# Patient Record
Sex: Male | Born: 1955 | Race: White | Hispanic: No | Smoking: Never smoker
Health system: Southern US, Community
[De-identification: ages and names within clinical notes are randomized; demographics above are authoritative.]

## PROBLEM LIST (undated history)

## (undated) DIAGNOSIS — M109 Gout, unspecified: Secondary | ICD-10-CM

## (undated) DIAGNOSIS — G20 Parkinson's disease: Secondary | ICD-10-CM

## (undated) DIAGNOSIS — G2581 Restless legs syndrome: Secondary | ICD-10-CM

## (undated) HISTORY — PX: BRAIN SURGERY: SHX531

## (undated) HISTORY — PX: TONSILLECTOMY: SUR1361

## (undated) HISTORY — PX: BACK SURGERY: SHX140

---

## 1997-10-10 ENCOUNTER — Emergency Department (HOSPITAL_COMMUNITY): Admission: EM | Admit: 1997-10-10 | Discharge: 1997-10-10 | Payer: Self-pay | Admitting: Internal Medicine

## 1998-09-02 ENCOUNTER — Inpatient Hospital Stay (HOSPITAL_COMMUNITY): Admission: EM | Admit: 1998-09-02 | Discharge: 1998-09-04 | Payer: Self-pay | Admitting: Pediatrics

## 1998-09-02 ENCOUNTER — Encounter: Payer: Self-pay | Admitting: *Deleted

## 1998-09-03 ENCOUNTER — Encounter: Payer: Self-pay | Admitting: *Deleted

## 1998-09-04 ENCOUNTER — Encounter: Payer: Self-pay | Admitting: *Deleted

## 1998-09-11 ENCOUNTER — Ambulatory Visit (HOSPITAL_COMMUNITY): Admission: RE | Admit: 1998-09-11 | Discharge: 1998-09-11 | Payer: Self-pay | Admitting: *Deleted

## 1998-09-11 ENCOUNTER — Encounter: Payer: Self-pay | Admitting: *Deleted

## 2002-11-13 ENCOUNTER — Encounter: Payer: Self-pay | Admitting: Emergency Medicine

## 2002-11-13 ENCOUNTER — Emergency Department (HOSPITAL_COMMUNITY): Admission: EM | Admit: 2002-11-13 | Discharge: 2002-11-13 | Payer: Self-pay | Admitting: Emergency Medicine

## 2009-09-27 ENCOUNTER — Ambulatory Visit: Payer: Self-pay | Admitting: Interventional Radiology

## 2009-09-27 ENCOUNTER — Emergency Department (HOSPITAL_BASED_OUTPATIENT_CLINIC_OR_DEPARTMENT_OTHER): Admission: EM | Admit: 2009-09-27 | Discharge: 2009-09-27 | Payer: Self-pay | Admitting: Emergency Medicine

## 2010-07-14 IMAGING — CR DG CHEST 2V
2 series · 2 of 2 positions shown · non-contrast
Comparison: None.

CLINICAL DATA: Leg swelling.  Short of breath.

CHEST - 2 VIEW

[w chest pa]
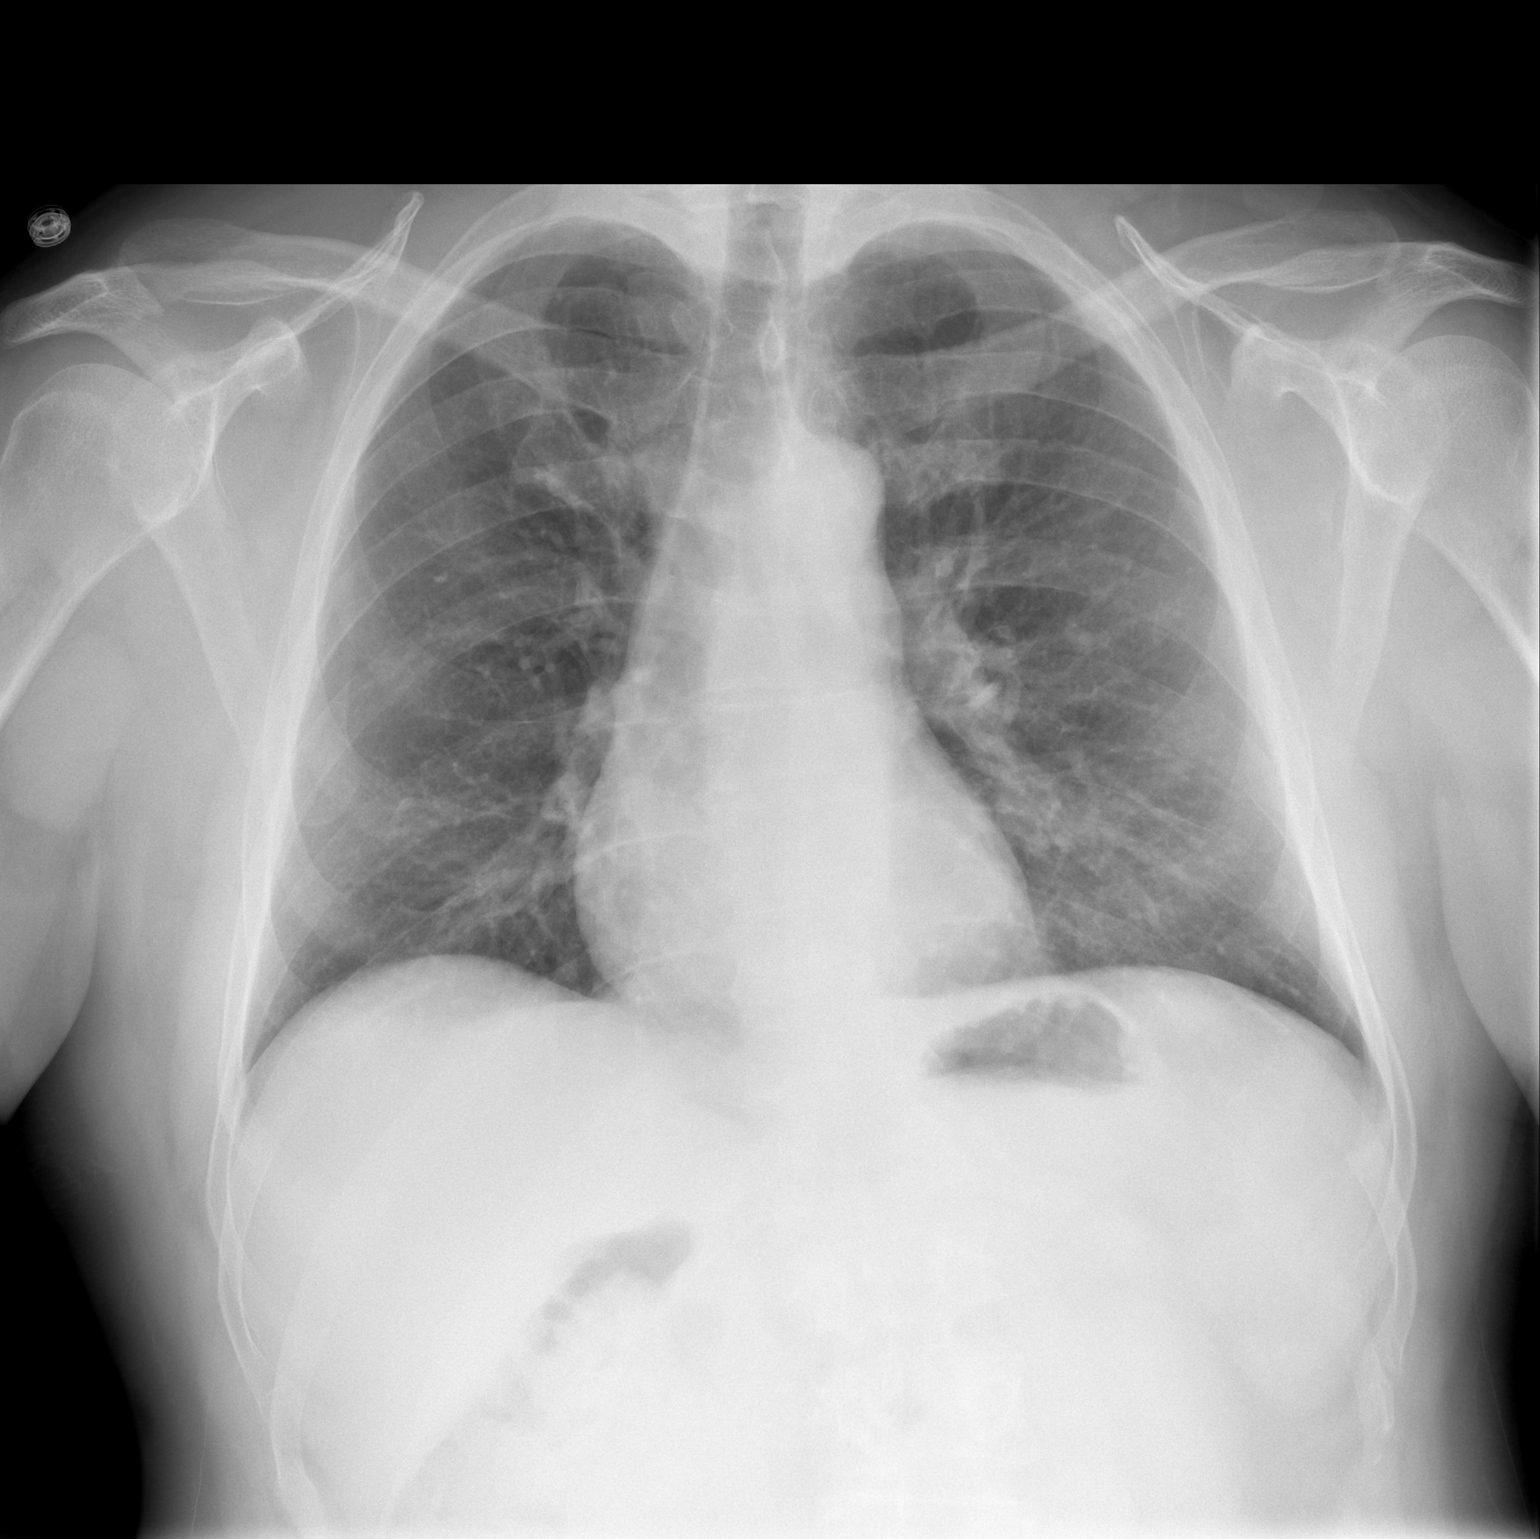

[w chest lat]
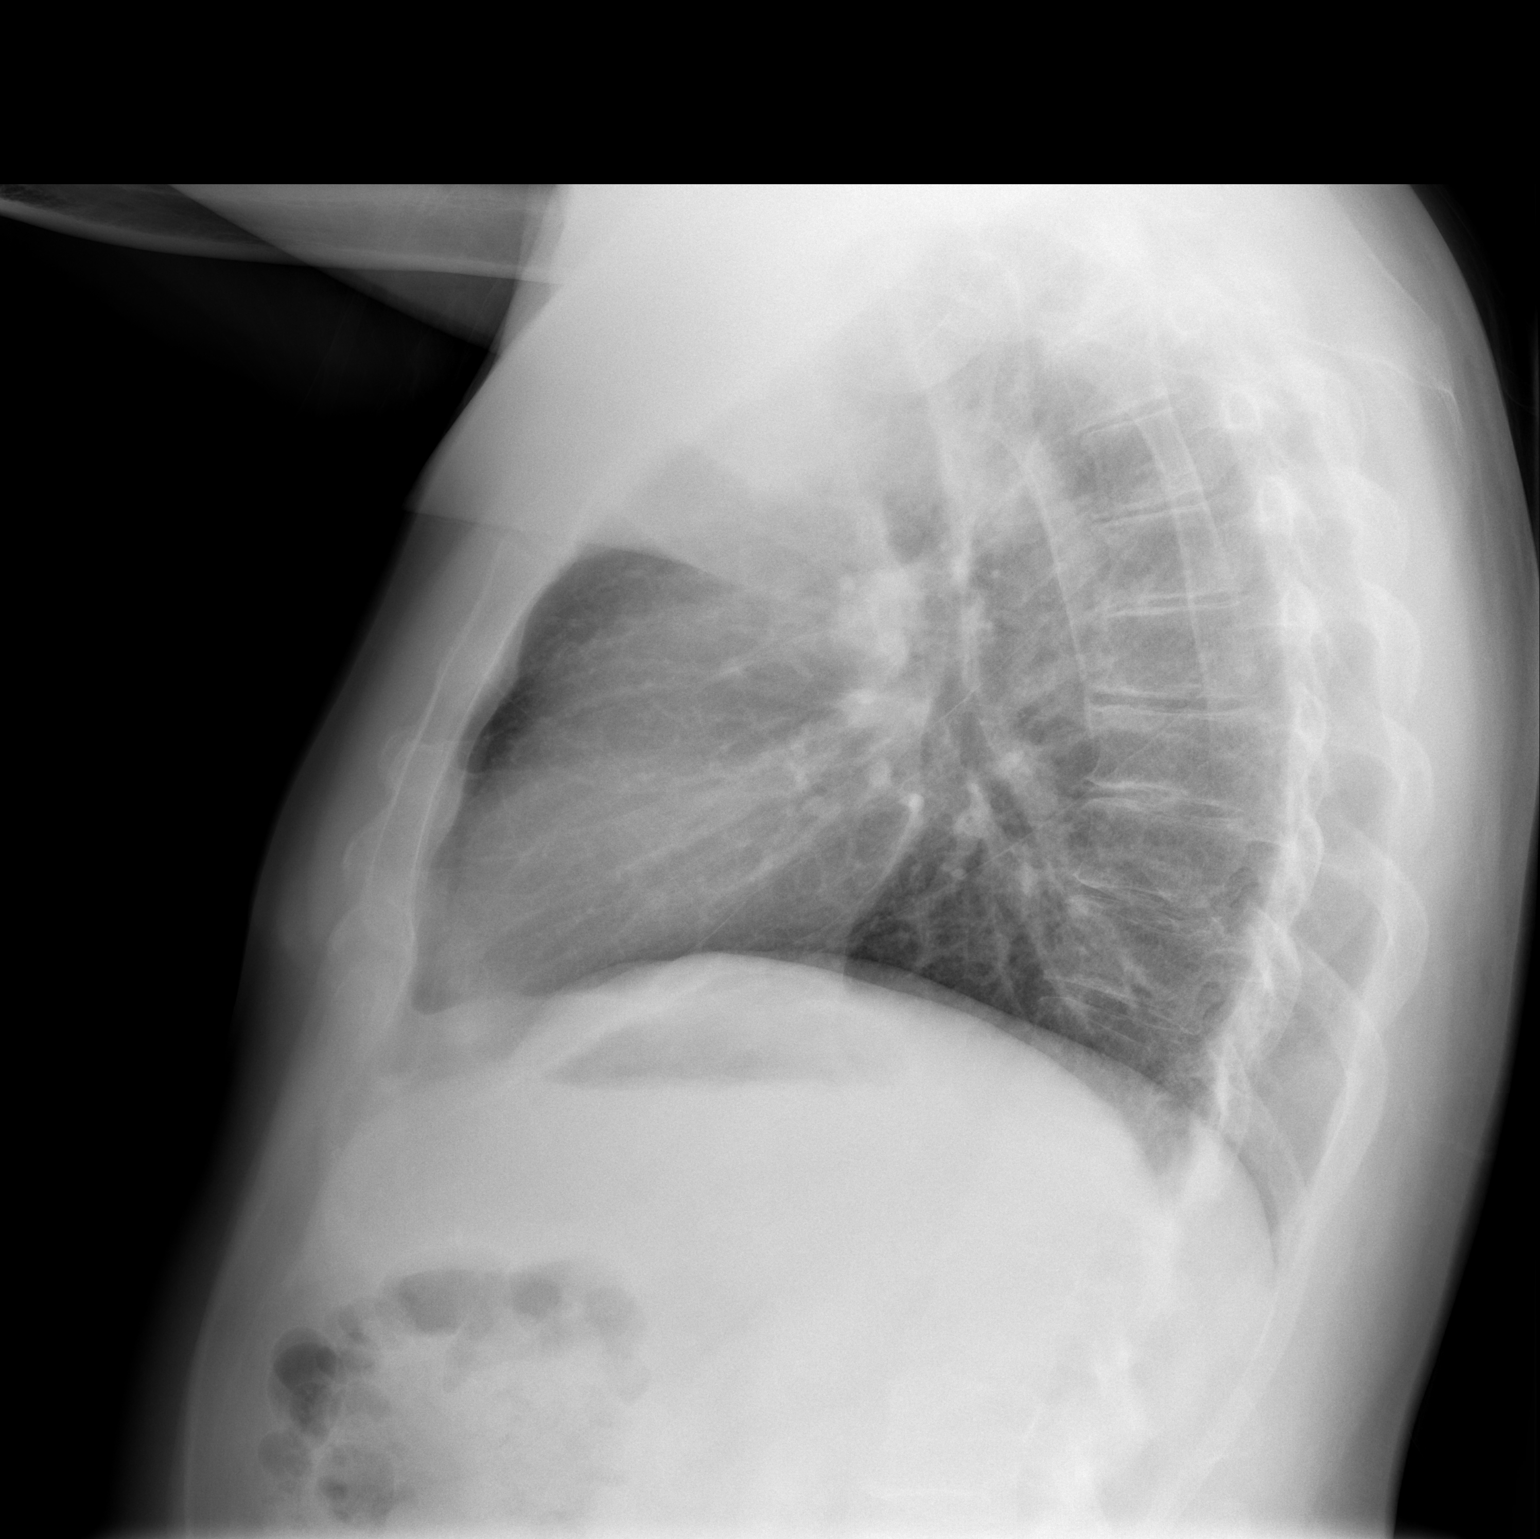

[2 of 2 positions shown; findings below may reference images not displayed]

FINDINGS: Normal heart size.  Clear lungs.
IMPRESSION: No active cardiopulmonary disease

## 2015-01-17 ENCOUNTER — Encounter (HOSPITAL_BASED_OUTPATIENT_CLINIC_OR_DEPARTMENT_OTHER): Payer: Self-pay | Admitting: Emergency Medicine

## 2015-01-17 ENCOUNTER — Emergency Department (HOSPITAL_BASED_OUTPATIENT_CLINIC_OR_DEPARTMENT_OTHER)
Admission: EM | Admit: 2015-01-17 | Discharge: 2015-01-17 | Disposition: A | Discharge status: Home / Self Care | Payer: Non-veteran care | Attending: Emergency Medicine | Admitting: Emergency Medicine

## 2015-01-17 DIAGNOSIS — M109 Gout, unspecified: Secondary | ICD-10-CM | POA: Diagnosis not present

## 2015-01-17 DIAGNOSIS — G20 Parkinson's disease: Secondary | ICD-10-CM | POA: Diagnosis not present

## 2015-01-17 DIAGNOSIS — Z880 Allergy status to penicillin: Secondary | ICD-10-CM | POA: Insufficient documentation

## 2015-01-17 DIAGNOSIS — Z79899 Other long term (current) drug therapy: Secondary | ICD-10-CM | POA: Insufficient documentation

## 2015-01-17 DIAGNOSIS — M79671 Pain in right foot: Secondary | ICD-10-CM | POA: Diagnosis present

## 2015-01-17 HISTORY — DX: Gout, unspecified: M10.9

## 2015-01-17 HISTORY — DX: Parkinson's disease: G20

## 2015-01-17 MED ORDER — INDOMETHACIN 50 MG PO CAPS: 50.0000 mg | ORAL_CAPSULE | Freq: Two times a day (BID) | ORAL | Status: AC

## 2015-01-17 MED ORDER — OXYCODONE-ACETAMINOPHEN 5-325 MG PO TABS: ORAL_TABLET | ORAL | Status: AC

## 2015-01-17 NOTE — ED Notes (Signed)
Presents with rt foot pain, area noted to be swollen and slight red in color, onset yesterday am. States out of current medication, has appointment with primary MD this wed

## 2015-01-17 NOTE — ED Provider Notes (Signed)
CSN: [id number]     Arrival date & time 01/17/15  1329 History   First MD Initiated Contact with Patient 01/17/15 1502     Chief Complaint  Patient presents with  . Foot Pain     (Consider location/radiation/quality/duration/timing/severity/associated sxs/prior Treatment) HPI  Blood pressure 112/69, pulse 94, temperature 98.5 F (36.9 C), temperature source Oral, resp. rate 18, height 5\' 10"  (1.778 m), weight 175 lb (79.379 kg), SpO2 97 %.  Frederick Acevedo is a 59 y.o. male complaining of just a patient of gout in the right great toe. States he normally takes allopurinol and indomethacin however he ran out of his indomethacin several days ago. His last gout flare up was 6 months ago. He states that he's been compliant with dietary restrictions. Patient recently moved to the area and has appointment at the [facility] but has not followed recently. The location of quality of the pain are consistent with his prior gout flareups and he denies any fever, chills. Patient is ambulatory with a walker.  Past Medical History  Diagnosis Date  . Gout   . Parkinson's disease    Past Surgical History  Procedure Laterality Date  . Brain surgery    . Back surgery    . Tonsillectomy     No family history on file. Social History  Substance Use Topics  . Smoking status: Never Smoker   . Smokeless tobacco: None  . Alcohol Use: No    Review of Systems  10 systems reviewed and found to be negative, except as noted in the HPI.  Allergies  Penicillins and Sulfa antibiotics  Home Medications   Prior to Admission medications   Medication Sig Start Date End Date Taking? Authorizing Provider  allopurinol (ZYLOPRIM) 100 MG tablet Take 100 mg by mouth daily.   Yes Historical Provider, MD   BP 112/69 mmHg  Pulse 94  Temp(Src) 98.5 F (36.9 C) (Oral)  Resp 18  Ht 5\' 10"  (1.778 m)  Wt 175 lb (79.379 kg)  BMI 25.11 kg/m2  SpO2 97% Physical Exam  Constitutional: He is oriented to person, place, and  time. He appears well-developed and well-nourished. No distress.  HENT:  Head: Normocephalic.  Mouth/Throat: Oropharynx is clear and moist.  Eyes: Conjunctivae and EOM are normal. Pupils are equal, round, and reactive to light.  Neck: Normal range of motion. Neck supple.  Cardiovascular: Normal rate, regular rhythm and intact distal pulses.   Pulmonary/Chest: Effort normal and breath sounds normal. No stridor. No respiratory distress. He has no wheezes. He has no rales. He exhibits no tenderness.  Abdominal: Soft. Bowel sounds are normal. He exhibits no distension and no mass. There is no tenderness. There is no rebound and no guarding.  Musculoskeletal: Normal range of motion.  Right great toe with erythema and warmth to swiftly tender to palpation.   Neurological: He is alert and oriented to person, place, and time.  Skin: Skin is warm.  Psychiatric: He has a normal mood and affect.  Nursing note and vitals reviewed.   ED Course  Procedures (including critical care time) Labs Review Labs Reviewed - No data to display  Imaging Review No results found. I have personally reviewed and evaluated these images and lab results as part of my medical decision-making.   EKG Interpretation None      MDM   Final diagnoses:  Acute gout of right foot, unspecified cause    Filed Vitals:   01/17/15 1344  BP: 112/69  Pulse: 94  Temp:  98.5 F (36.9 C)  TempSrc: Oral  Resp: 18  Height:  (1.778 m)  Weight: 175 lb (79.379 kg)  SpO2: 97%     Frederick Acevedo is a pleasant 59 y.o. male presenting with exacerbation of her chronic gout. Patient has run out of his indomethacin. States that this consistent with his prior gout flareups.  Evaluation does not show pathology that would require ongoing emergent intervention or inpatient treatment. Pt is hemodynamically stable and mentating appropriately. Discussed findings and plan with patient/guardian, who agrees with care plan. All  questions answered. Return precautions discussed and outpatient follow up given.   Discharge Medication List as of 01/17/2015  3:09 PM    START taking these medications   Details  indomethacin (INDOCIN) 50 MG capsule Take 1 capsule (50 mg total) by mouth 2 (two) times daily with a meal., Starting 01/17/2015, Until Discontinued, Print    oxyCODONE-acetaminophen (PERCOCET/ROXICET) 5-325 MG per tablet 1 to 2 tabs PO q6hrs  PRN for pain, Print             Wynetta Emery, PA-C 01/17/15 1558  Vanetta Mulders, MD 01/21/15 2052

## 2015-01-17 NOTE — Discharge Instructions (Signed)
Please follow with your primary care doctor in the next 2 days for a check-up. They must obtain records for further management.   Do not hesitate to return to the Emergency Department for any new, worsening or concerning symptoms.   Take percocet for breakthrough pain, do not drink alcohol, drive, care for children or do other critical tasks while taking percocet.   Gout Gout is an inflammatory arthritis caused by a buildup of uric acid crystals in the joints. Uric acid is a chemical that is normally present in the blood. When the level of uric acid in the blood is too high it can form crystals that deposit in your joints and tissues. This causes joint redness, soreness, and swelling (inflammation). Repeat attacks are common. Over time, uric acid crystals can form into masses (tophi) near a joint, destroying bone and causing disfigurement. Gout is treatable and often preventable. CAUSES  The disease begins with elevated levels of uric acid in the blood. Uric acid is produced by your body when it breaks down a naturally found substance called purines. Certain foods you eat, such as meats and fish, contain high amounts of purines. Causes of an elevated uric acid level include:  Being passed down from parent to child (heredity).  Diseases that cause increased uric acid production (such as obesity, psoriasis, and certain cancers).  Excessive alcohol use.  Diet, especially diets rich in meat and seafood.  Medicines, including certain cancer-fighting medicines (chemotherapy), water pills (diuretics), and aspirin.  Chronic kidney disease. The kidneys are no longer able to remove uric acid well.  Problems with metabolism. Conditions strongly associated with gout include:  Obesity.  High blood pressure.  High cholesterol.  Diabetes. Not everyone with elevated uric acid levels gets gout. It is not understood why some people get gout and others do not. Surgery, joint injury, and eating too  much of certain foods are some of the factors that can lead to gout attacks. SYMPTOMS   An attack of gout comes on quickly. It causes intense pain with redness, swelling, and warmth in a joint.  Fever can occur.  Often, only one joint is involved. Certain joints are more commonly involved:  Base of the big toe.  Knee.  Ankle.  Wrist.  Finger. Without treatment, an attack usually goes away in a few days to weeks. Between attacks, you usually will not have symptoms, which is different from many other forms of arthritis. DIAGNOSIS  Your caregiver will suspect gout based on your symptoms and exam. In some cases, tests may be recommended. The tests may include:  Blood tests.  Urine tests.  X-rays.  Joint fluid exam. This exam requires a needle to remove fluid from the joint (arthrocentesis). Using a microscope, gout is confirmed when uric acid crystals are seen in the joint fluid. TREATMENT  There are two phases to gout treatment: treating the sudden onset (acute) attack and preventing attacks (prophylaxis).  Treatment of an Acute Attack.  Medicines are used. These include anti-inflammatory medicines or steroid medicines.  An injection of steroid medicine into the affected joint is sometimes necessary.  The painful joint is rested. Movement can worsen the arthritis.  You may use warm or cold treatments on painful joints, depending which works best for you.  Treatment to Prevent Attacks.  If you suffer from frequent gout attacks, your caregiver may advise preventive medicine. These medicines are started after the acute attack subsides. These medicines either help your kidneys eliminate uric acid from your body or  decrease your uric acid production. You may need to stay on these medicines for a very long time.  The early phase of treatment with preventive medicine can be associated with an increase in acute gout attacks. For this reason, during the first few months of  treatment, your caregiver may also advise you to take medicines usually used for acute gout treatment. Be sure you understand your caregiver's directions. Your caregiver may make several adjustments to your medicine dose before these medicines are effective.  Discuss dietary treatment with your caregiver or dietitian. Alcohol and drinks high in sugar and fructose and foods such as meat, poultry, and seafood can increase uric acid levels. Your caregiver or dietitian can advise you on drinks and foods that should be limited. HOME CARE INSTRUCTIONS   Do not take aspirin to relieve pain. This raises uric acid levels.  Only take over-the-counter or prescription medicines for pain, discomfort, or fever as directed by your caregiver.  Rest the joint as much as possible. When in bed, keep sheets and blankets off painful areas.  Keep the affected joint raised (elevated).  Apply warm or cold treatments to painful joints. Use of warm or cold treatments depends on which works best for you.  Use crutches if the painful joint is in your leg.  Drink enough fluids to keep your urine clear or pale yellow. This helps your body get rid of uric acid. Limit alcohol, sugary drinks, and fructose drinks.  Follow your dietary instructions. Pay careful attention to the amount of protein you eat. Your daily diet should emphasize fruits, vegetables, whole grains, and fat-free or low-fat milk products. Discuss the use of coffee, vitamin C, and cherries with your caregiver or dietitian. These may be helpful in lowering uric acid levels.  Maintain a healthy body weight. SEEK MEDICAL CARE IF:   You develop diarrhea, vomiting, or any side effects from medicines.  You do not feel better in 24 hours, or you are getting worse. SEEK IMMEDIATE MEDICAL CARE IF:   Your joint becomes suddenly more tender, and you have chills or a fever. MAKE SURE YOU:   Understand these instructions.  Will watch your condition.  Will get  help right away if you are not doing well or get worse. Document Released: 04/15/2000 Document Revised: 09/02/2013 Document Reviewed: 11/30/2011 [facility] Patient Information 2015 [place], [place]. This information is not intended to replace advice given to you by your health care provider. Make sure you discuss any questions you have with your health care provider.

## 2015-01-17 NOTE — ED Notes (Signed)
Pt states he is having a gout flare up in right foot, pain in right big toe joint, normally takes indomethicin for relief but it currently out of Rx for it.  Takes allopurinol daily.

## 2015-02-22 ENCOUNTER — Emergency Department (HOSPITAL_COMMUNITY): Payer: Non-veteran care

## 2015-02-22 ENCOUNTER — Emergency Department (HOSPITAL_COMMUNITY)
Admission: EM | Admit: 2015-02-22 | Discharge: 2015-02-22 | Disposition: A | Discharge status: Home / Self Care | Payer: Non-veteran care | Attending: Emergency Medicine | Admitting: Emergency Medicine

## 2015-02-22 ENCOUNTER — Other Ambulatory Visit: Payer: Self-pay

## 2015-02-22 ENCOUNTER — Encounter (HOSPITAL_COMMUNITY): Payer: Self-pay

## 2015-02-22 DIAGNOSIS — T39095A Adverse effect of salicylates, initial encounter: Secondary | ICD-10-CM | POA: Insufficient documentation

## 2015-02-22 DIAGNOSIS — R531 Weakness: Secondary | ICD-10-CM | POA: Diagnosis not present

## 2015-02-22 DIAGNOSIS — M109 Gout, unspecified: Secondary | ICD-10-CM | POA: Diagnosis not present

## 2015-02-22 DIAGNOSIS — Z880 Allergy status to penicillin: Secondary | ICD-10-CM | POA: Diagnosis not present

## 2015-02-22 DIAGNOSIS — R079 Chest pain, unspecified: Secondary | ICD-10-CM | POA: Insufficient documentation

## 2015-02-22 DIAGNOSIS — Z79899 Other long term (current) drug therapy: Secondary | ICD-10-CM | POA: Insufficient documentation

## 2015-02-22 DIAGNOSIS — G20 Parkinson's disease: Secondary | ICD-10-CM | POA: Insufficient documentation

## 2015-02-22 DIAGNOSIS — T50905A Adverse effect of unspecified drugs, medicaments and biological substances, initial encounter: Secondary | ICD-10-CM

## 2015-02-22 HISTORY — DX: Restless legs syndrome: G25.81

## 2015-02-22 LAB — URINALYSIS, ROUTINE W REFLEX MICROSCOPIC
Bilirubin Urine: NEGATIVE
Glucose, UA: NEGATIVE mg/dL
Hgb urine dipstick: NEGATIVE
Ketones, ur: NEGATIVE mg/dL
Leukocytes, UA: NEGATIVE
Nitrite: NEGATIVE
Protein, ur: NEGATIVE mg/dL
Specific Gravity, Urine: 1.024 (ref 1.005–1.030)
Urobilinogen, UA: 0.2 mg/dL (ref 0.0–1.0)
pH: 5 (ref 5.0–8.0)

## 2015-02-22 LAB — CBC WITH DIFFERENTIAL/PLATELET
Basophils Absolute: 0.1 10*3/uL (ref 0.0–0.1)
Basophils Relative: 1 %
Eosinophils Absolute: 0.1 10*3/uL (ref 0.0–0.7)
Eosinophils Relative: 2 %
HCT: 43.6 % (ref 39.0–52.0)
Hemoglobin: 14.5 g/dL (ref 13.0–17.0)
Lymphocytes Relative: 28 %
Lymphs Abs: 1.8 10*3/uL (ref 0.7–4.0)
MCH: 31.3 pg (ref 26.0–34.0)
MCHC: 33.3 g/dL (ref 30.0–36.0)
MCV: 94.2 fL (ref 78.0–100.0)
Monocytes Absolute: 0.4 10*3/uL (ref 0.1–1.0)
Monocytes Relative: 7 %
Neutro Abs: 4 10*3/uL (ref 1.7–7.7)
Neutrophils Relative %: 62 %
Platelets: 141 10*3/uL — ABNORMAL LOW (ref 150–400)
RBC: 4.63 MIL/uL (ref 4.22–5.81)
RDW: 13 % (ref 11.5–15.5)
WBC: 6.3 10*3/uL (ref 4.0–10.5)

## 2015-02-22 LAB — COMPREHENSIVE METABOLIC PANEL
ALT: 8 U/L — ABNORMAL LOW (ref 17–63)
AST: 17 U/L (ref 15–41)
Albumin: 3.9 g/dL (ref 3.5–5.0)
Alkaline Phosphatase: 83 U/L (ref 38–126)
Anion gap: 8 (ref 5–15)
BUN: 19 mg/dL (ref 6–20)
CO2: 25 mmol/L (ref 22–32)
Calcium: 9.2 mg/dL (ref 8.9–10.3)
Chloride: 107 mmol/L (ref 101–111)
Creatinine, Ser: 1.12 mg/dL (ref 0.61–1.24)
GFR calc Af Amer: >60 mL/min (ref >60)
GFR calc non Af Amer: >60 mL/min (ref >60)
Glucose, Bld: 111 mg/dL — ABNORMAL HIGH (ref 65–99)
Potassium: 4.1 mmol/L (ref 3.5–5.1)
Sodium: 140 mmol/L (ref 135–145)
Total Bilirubin: 0.5 mg/dL (ref 0.3–1.2)
Total Protein: 6.6 g/dL (ref 6.5–8.1)

## 2015-02-22 LAB — TROPONIN I
Troponin I: <0.03 ng/mL (ref <0.031)
Troponin I: <0.03 ng/mL (ref <0.031)

## 2015-02-22 MED ORDER — MORPHINE SULFATE (PF) 4 MG/ML IV SOLN
4.0000 mg | Freq: Once | INTRAVENOUS | Status: AC
  Administered 2015-02-22: 4 mg via INTRAVENOUS
  Filled 2015-02-22: qty 1

## 2015-02-22 MED ORDER — ONDANSETRON HCL 4 MG/2ML IJ SOLN
4.0000 mg | Freq: Once | INTRAMUSCULAR | Status: AC
  Administered 2015-02-22: 4 mg via INTRAVENOUS
  Filled 2015-02-22: qty 2

## 2015-02-22 MED ORDER — HYDROMORPHONE HCL 1 MG/ML IJ SOLN: 1.0000 mg | Freq: Once | INTRAMUSCULAR | Status: DC

## 2015-02-22 NOTE — ED Notes (Signed)
Pt. Reported, "We do not have any heat at our house, "I need DSS>"

## 2015-02-22 NOTE — ED Notes (Signed)
Pt stable, ambulatory, states understanding of discharge instructions 

## 2015-02-22 NOTE — Discharge Instructions (Signed)
I would discontinue the medicine that you recently started about follow-up with your primary care doctor.  Return here as needed

## 2015-02-22 NOTE — ED Notes (Addendum)
To room via EMS.  Pt woke up with center chest pressure, rates 6/10 and leg weakness.  Pt also reports dizziness, shortness of breath, headache  and diarrhea. pts daughter told EMS that pt is usually A&O and up walking around.  Pt is alert to person and situation only. Pt  thought he still lived at [facility] and not oriented to time.  Pt took new med this morning for restless leg- Ropinirole and pts daughter thinks he might be having reaction to med.  Pt has widespread flat redness today.  Pts daughter reported to EMS that she thought this came from new medication.  Pt reports slight itching.  EMS gave ASA 324 mg, NTG x 1.  EMS reports that pt was slow to chew and swallow ASA.

## 2015-02-22 NOTE — ED Provider Notes (Signed)
CSN: [id number]     Arrival date & time 02/22/15  1054 History   First MD Initiated Contact with Patient 02/22/15 1054     Chief Complaint  Patient presents with  . Chest Pain  . Weakness    (Consider location/radiation/quality/duration/timing/severity/associated sxs/prior Treatment) HPI   Frederick Acevedo is a 59 yo Caucasian male with history of Parkinson's disease and Restless leg syndrome who presents to the Emergency Department for evaluation of center chest pressure that started when he woke up this morning. Pt denies radiation of pain, and he currently rates chest pain as 6/10 post ASA 324 mg, NTG x 1 administered by EMS. Pt endorses sob, dizziness, headache, and diarrhea. He also complains of weakness in bilateral legs from his waist down. 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 He is only oriented to person and somewhat to situation. He thinks he is in [facility] right now. Pt's daughter reported that he started a new medication, Ropinirole, for RLS this morning and she is concerned that he might be developing a drug reaction. She states he is usually alert and oriented and is not acting like his usual self today.   Past Medical History  Diagnosis Date  . Gout   . Parkinson's disease (HCC)   . Restless leg syndrome    Past Surgical History  Procedure Laterality Date  . Brain surgery    . Back surgery    . Tonsillectomy     History reviewed. No pertinent family history. Social History  Substance Use Topics  . Smoking status: Never Smoker   . Smokeless tobacco: None  . Alcohol Use: No    Review of Systems  All other systems negative except as documented in the HPI. All pertinent positives and negatives as reviewed in the HPI.   Allergies  Penicillins and Sulfa antibiotics  Home Medications   Prior to Admission medications   Medication Sig Start Date End Date Taking? Authorizing Provider  allopurinol (ZYLOPRIM) 100 MG tablet Take 100 mg  by mouth daily.    Historical Provider, MD  indomethacin (INDOCIN) 50 MG capsule Take 1 capsule (50 mg total) by mouth 2 (two) times daily with a meal. 01/17/15   Nicole Pisciotta, PA-C  oxyCODONE-acetaminophen (PERCOCET/ROXICET) 5-325 MG per tablet 1 to 2 tabs PO q6hrs  PRN for pain 01/17/15   Nicole Pisciotta, PA-C   SpO2 94% Physical Exam  Constitutional: He appears well-developed and well-nourished. No distress.  Pt is alert and oriented to person and somewhat to situation.   HENT:  Head: Normocephalic and atraumatic.  Mouth/Throat: Oropharynx is clear and moist.  Eyes: Pupils are equal, round, and reactive to light.  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 Neck supple.  Cardiovascular: Normal rate, regular rhythm and intact distal pulses.  Exam reveals no gallop and no friction rub.   No murmur heard. Pulmonary/Chest: Effort normal and breath sounds normal. No respiratory distress. He has no wheezes.  Abdominal: Soft. Bowel sounds are normal. He exhibits no distension and no mass. There is no rebound and no guarding.  General abdominal tenderness to palpation  Musculoskeletal: He exhibits no edema.  Neurological: He is alert. He exhibits normal muscle tone. Coordination normal.  Skin: Skin is warm. Rash noted.  Bilateral widespread morbilliform rash on anterior thighs  Nursing note and vitals reviewed.   ED Course  Procedures (including critical care time) Labs Review  Labs Reviewed  CBC WITH DIFFERENTIAL/PLATELET - Abnormal; Notable for the following:    Platelets 141 (*)    All other components within normal limits  COMPREHENSIVE METABOLIC PANEL - Abnormal; Notable for the following:    Glucose, Bld 111 (*)    ALT 8 (*)    All other components within normal limits  TROPONIN I  URINALYSIS, ROUTINE W REFLEX MICROSCOPIC (NOT AT [facility])  TROPONIN I    Imaging Review Dg Chest 2 View  02/22/2015  CLINICAL DATA:  Chest  pressure, leg weakness, dizziness, shortness of breath EXAM: CHEST  2 VIEW COMPARISON:  09/27/2009 FINDINGS: Lungs are clear.  No pleural effusion or pneumothorax. The heart is normal in size. Mild degenerative changes of the visualized thoracolumbar spine. IMPRESSION: No evidence of acute cardiopulmonary disease. Electronically Signed   By: Charline BillsSriyesh  Krishnan M.D.   On: 02/22/2015 13:29   I have personally reviewed and evaluated these images and lab results as part of my medical decision-making.   EKG Interpretation None     1:30pm - Pt continues to report pain in his chest with minimal relief and weakness and pain in bilateral legs. He just took his meds for PD brought by his son-in-law.  Patient is feeling better at this time.  He will be referred back to his primary care doctor.  Advised him not to take the medication that he just started due to the fact that I felt this caused his problem.  Patient agrees the plan and all questions were answered    Charlestine NightChristopher Neythan Kozlov, PA-C 02/22/15 2019  Raeford RazorStephen Kohut, MD 02/24/15 [phone]

## 2015-02-22 NOTE — Clinical Social Work Note (Signed)
Clinical Social Work Assessment  Patient Details  Name: Frederick Acevedo MRN: [id number] Date of Birth: August 29, 1955  Date of referral:  02/22/15               Reason for consult:  [facility]                Permission sought to share information with:  Case Production designer, theatre/television/filmManager, Family Supports Permission granted to share information::  Yes, Verbal Permission Granted  Name::     Administrator, artsKyle   Agency::     Relationship::  Son-in-law  Contact Information:     Housing/Transportation Living arrangements for the past 2 months:  Single Family Home Source of Information:  Patient Patient Interpreter Needed:  None Criminal Activity/Legal Involvement Pertinent to Current Situation/Hospitalization:  No - Comment as needed Significant Relationships:  Adult Children, Other(Comment) (Son-in-law) Lives with:  Adult Children Do you feel safe going back to the place where you live?  Yes Need for family participation in patient care:  Yes (Comment)  Care giving concerns:  Son-in-law Frederick Acevedo informed CSW of their difficulties with getting their heat turned back on. Frederick Acevedo stated he was very appreciative of the patient's effort to help them find assistance.    Social Worker assessment / plan:  CSW received phone call from RN regarding patients concerns with utilities. CSW went to speak with patient, introduced self and acknowledged the patient. Patient was alert and oriented. Son-in-law Frederick Acevedo was the bedside. Patient informed CSW that he has built up a lot of anxiety for his daughter and son-in-law, stating "I want to help them as much as possible because it's a lot caring for me and having a newborn baby on the way". Patient stated the family was in need of heat in the home. CSW provided patient and son-in-law with resources for utility assistance and advised the family to contact the local [employer] to explore possible assistance there. Patient and family was very appreciative of the services and resources provided  by CSW. No further needs were reported at this time. CSW to sign off. Please consult if further CSW needs arise.   Employment status:  Disabled (Comment on whether or not currently receiving Disability) Insurance information:  [employer] Benefit PT Recommendations:  Not assessed at this time Information / Referral to community resources:  Other (Comment Required) (Utility assistance)  Patient/Family's Response to care:  Patient and family was appreciative of the services provided by CSW.   Patient/Family's Understanding of and Emotional Response to Diagnosis, Current Treatment, and Prognosis:  Patient and family aware and understanding of steps to take in order to get assistance. No further needs reported.   Emotional Assessment Appearance:  Appears stated age Attitude/Demeanor/Rapport:   (Calm and cooperative) Affect (typically observed):  Accepting, Overwhelmed Orientation:  Oriented to Self, Oriented to Place, Oriented to  Time Alcohol / Substance use:  Not Applicable Psych involvement (Current and /or in the community):  No (Comment)  Discharge Needs  Concerns to be addressed:  Basic Needs Readmission within the last 30 days:  No Current discharge risk:  None Barriers to Discharge:  Barriers Resolved   Loleta DickerJoyce S Rashi Granier, LCSW 02/22/2015, 1:56 PM

## 2015-12-09 IMAGING — CR DG CHEST 2V
2 series · 2 of 2 positions shown · non-contrast
Comparison: 09/27/2009

CLINICAL DATA: Chest pressure, leg weakness, dizziness, shortness
of breath

EXAM:
CHEST  2 VIEW

[chest lat]
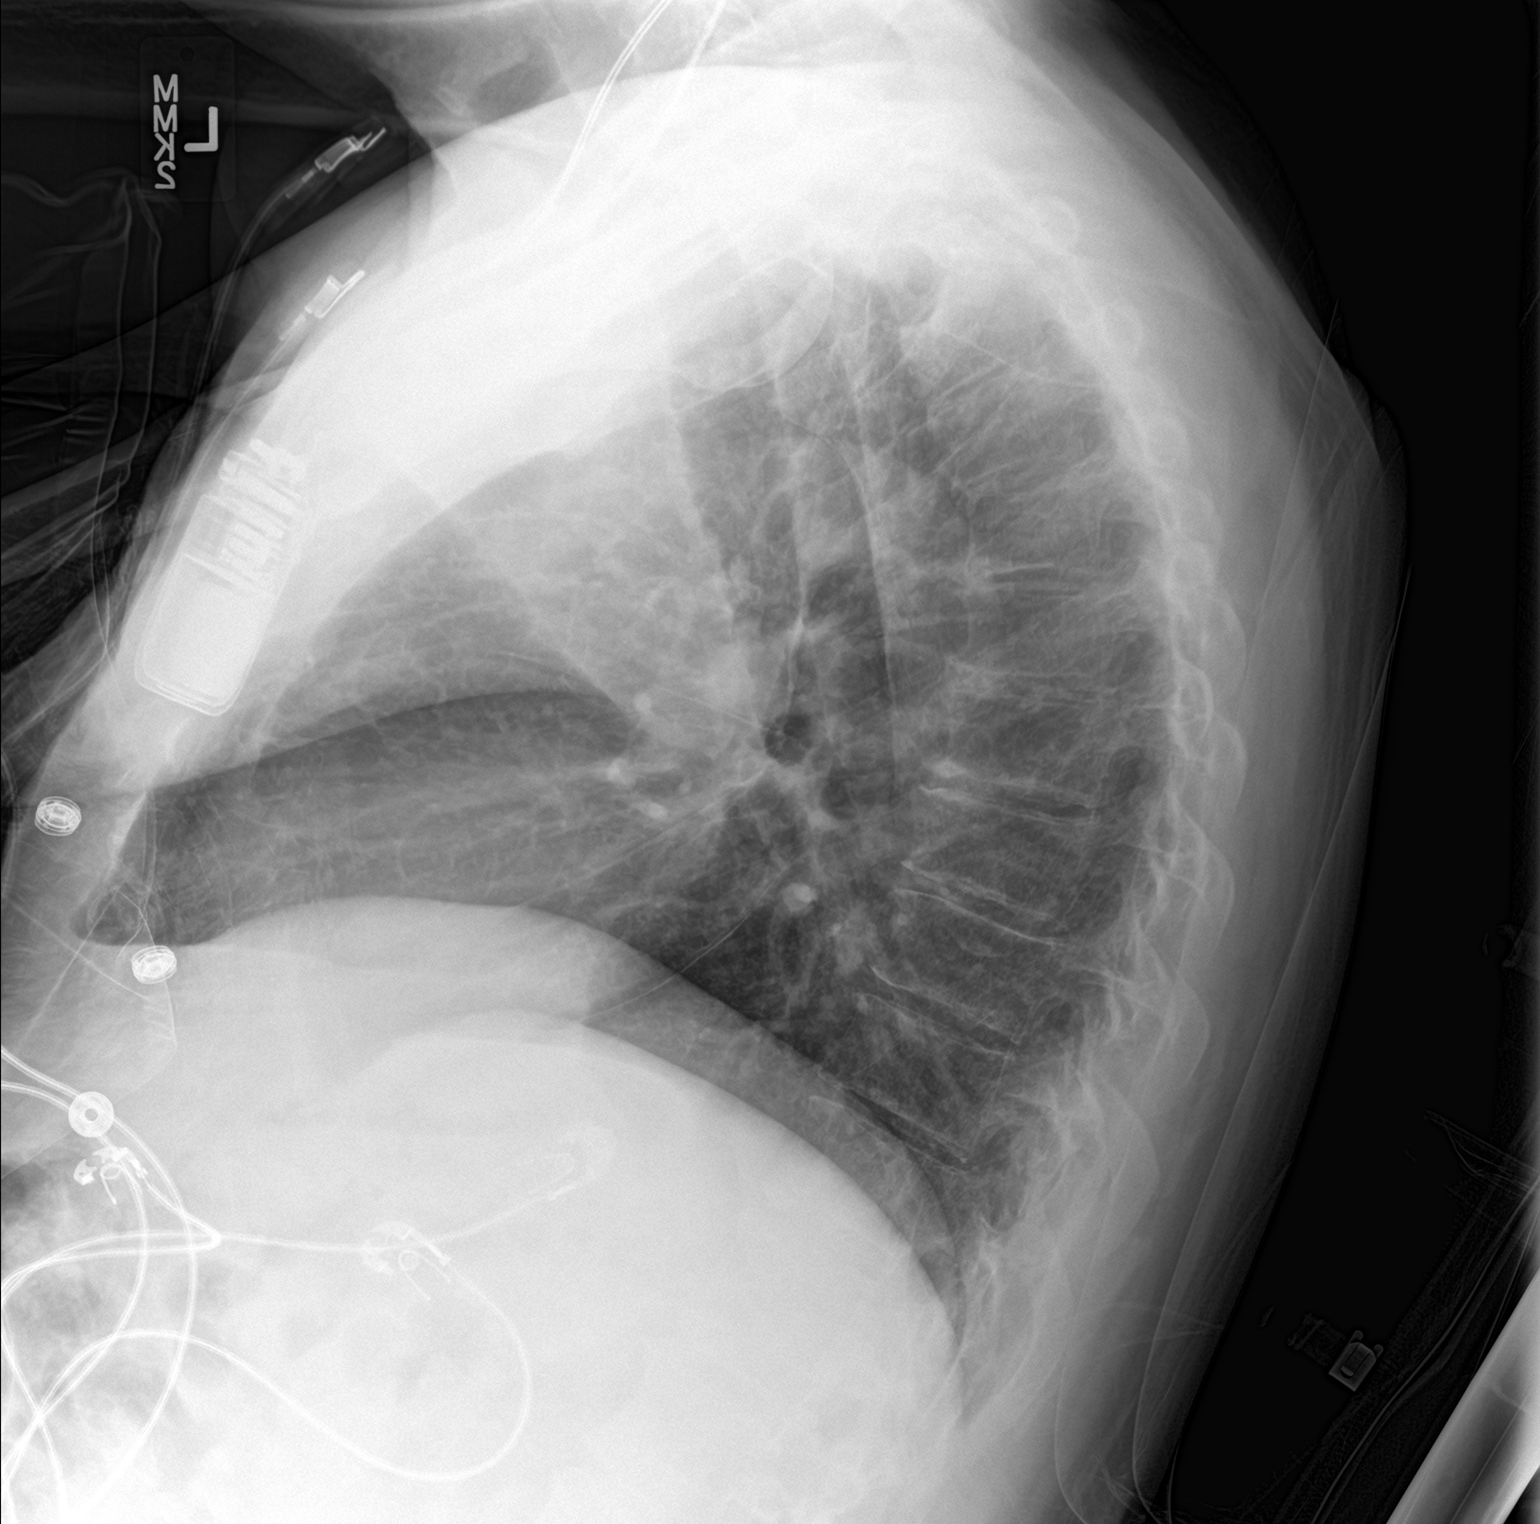

[chest ap]
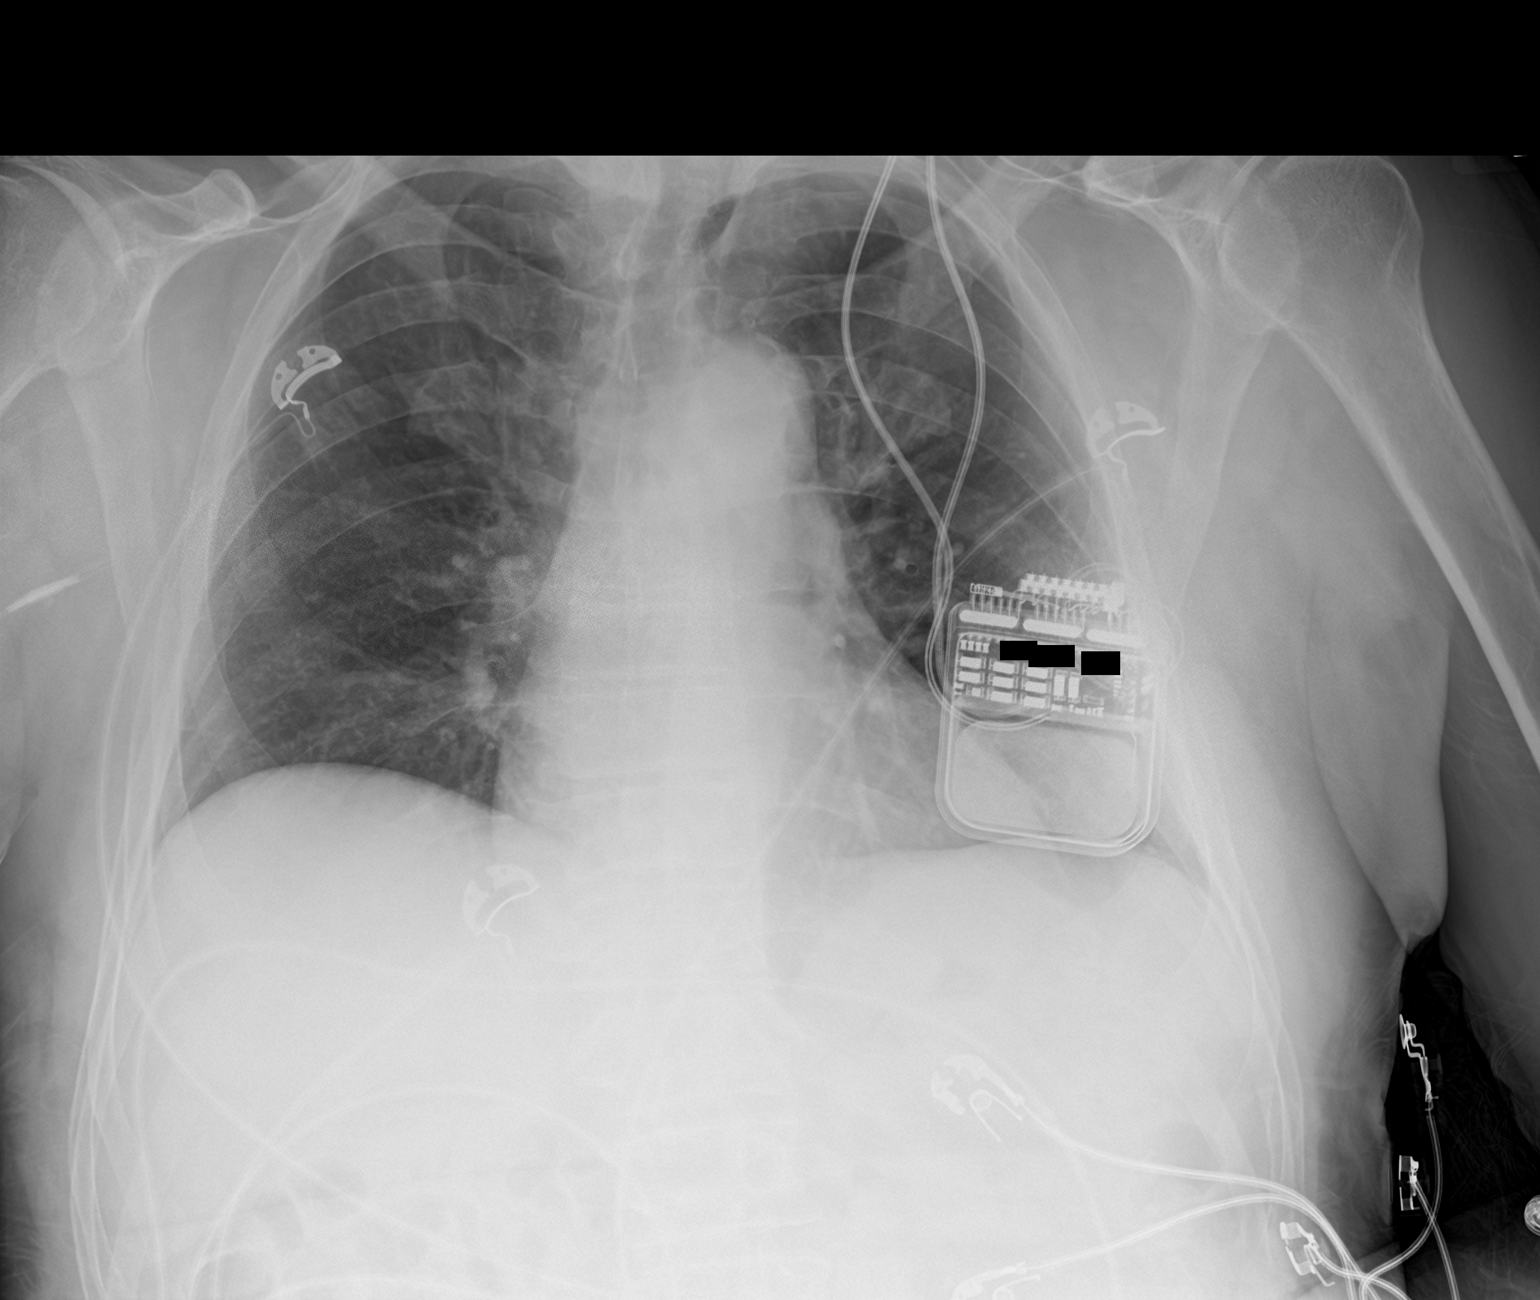

[2 of 2 positions shown; findings below may reference images not displayed]

FINDINGS: Lungs are clear.  No pleural effusion or pneumothorax.

The heart is normal in size.

Mild degenerative changes of the visualized thoracolumbar spine.
IMPRESSION: No evidence of acute cardiopulmonary disease.
